# Patient Record
Sex: Female | Born: 1965 | Race: White | Hispanic: No | Marital: Single | Smoking: Light tobacco smoker
Health system: Southern US, Community
[De-identification: ages and names within clinical notes are randomized; demographics above are authoritative.]

## PROBLEM LIST (undated history)

## (undated) DIAGNOSIS — I10 Essential (primary) hypertension: Secondary | ICD-10-CM

---

## 2019-12-07 ENCOUNTER — Emergency Department (HOSPITAL_BASED_OUTPATIENT_CLINIC_OR_DEPARTMENT_OTHER)
Admission: EM | Admit: 2019-12-07 | Discharge: 2019-12-07 | Discharge status: Against Medical Advice | Payer: Managed Care, Other (non HMO) | Attending: Emergency Medicine | Admitting: Emergency Medicine

## 2019-12-07 ENCOUNTER — Emergency Department (HOSPITAL_BASED_OUTPATIENT_CLINIC_OR_DEPARTMENT_OTHER): Payer: Managed Care, Other (non HMO)

## 2019-12-07 ENCOUNTER — Encounter (HOSPITAL_BASED_OUTPATIENT_CLINIC_OR_DEPARTMENT_OTHER): Payer: Self-pay | Admitting: *Deleted

## 2019-12-07 ENCOUNTER — Other Ambulatory Visit: Payer: Self-pay

## 2019-12-07 DIAGNOSIS — F17210 Nicotine dependence, cigarettes, uncomplicated: Secondary | ICD-10-CM | POA: Insufficient documentation

## 2019-12-07 DIAGNOSIS — R1011 Right upper quadrant pain: Secondary | ICD-10-CM | POA: Diagnosis not present

## 2019-12-07 DIAGNOSIS — Z5329 Procedure and treatment not carried out because of patient's decision for other reasons: Secondary | ICD-10-CM | POA: Insufficient documentation

## 2019-12-07 DIAGNOSIS — Z79899 Other long term (current) drug therapy: Secondary | ICD-10-CM | POA: Insufficient documentation

## 2019-12-07 DIAGNOSIS — K7200 Acute and subacute hepatic failure without coma: Secondary | ICD-10-CM | POA: Diagnosis not present

## 2019-12-07 DIAGNOSIS — I10 Essential (primary) hypertension: Secondary | ICD-10-CM | POA: Insufficient documentation

## 2019-12-07 DIAGNOSIS — R7989 Other specified abnormal findings of blood chemistry: Secondary | ICD-10-CM

## 2019-12-07 DIAGNOSIS — R109 Unspecified abdominal pain: Secondary | ICD-10-CM | POA: Diagnosis present

## 2019-12-07 HISTORY — DX: Essential (primary) hypertension: I10

## 2019-12-07 LAB — COMPREHENSIVE METABOLIC PANEL
ALT: 1853 U/L — ABNORMAL HIGH (ref 0–44)
AST: 3039 U/L — ABNORMAL HIGH (ref 15–41)
Albumin: 3.5 g/dL (ref 3.5–5.0)
Alkaline Phosphatase: 160 U/L — ABNORMAL HIGH (ref 38–126)
Anion gap: 10 (ref 5–15)
BUN: 16 mg/dL (ref 6–20)
CO2: 28 mmol/L (ref 22–32)
Calcium: 8.3 mg/dL — ABNORMAL LOW (ref 8.9–10.3)
Chloride: 97 mmol/L — ABNORMAL LOW (ref 98–111)
Creatinine, Ser: 0.59 mg/dL (ref 0.44–1.00)
GFR calc Af Amer: >60 mL/min (ref >60)
GFR calc non Af Amer: >60 mL/min (ref >60)
Glucose, Bld: 103 mg/dL — ABNORMAL HIGH (ref 70–99)
Potassium: 4 mmol/L (ref 3.5–5.1)
Sodium: 135 mmol/L (ref 135–145)
Total Bilirubin: 2.3 mg/dL — ABNORMAL HIGH (ref 0.3–1.2)
Total Protein: 5.9 g/dL — ABNORMAL LOW (ref 6.5–8.1)

## 2019-12-07 LAB — CBC WITH DIFFERENTIAL/PLATELET
Abs Immature Granulocytes: 0.02 10*3/uL (ref 0.00–0.07)
Basophils Absolute: 0 10*3/uL (ref 0.0–0.1)
Basophils Relative: 1 %
Eosinophils Absolute: 0 10*3/uL (ref 0.0–0.5)
Eosinophils Relative: 1 %
HCT: 44.5 % (ref 36.0–46.0)
Hemoglobin: 14.9 g/dL (ref 12.0–15.0)
Immature Granulocytes: 0 %
Lymphocytes Relative: 25 %
Lymphs Abs: 1.5 10*3/uL (ref 0.7–4.0)
MCH: 35.5 pg — ABNORMAL HIGH (ref 26.0–34.0)
MCHC: 33.5 g/dL (ref 30.0–36.0)
MCV: 106 fL — ABNORMAL HIGH (ref 80.0–100.0)
Monocytes Absolute: 0.2 10*3/uL (ref 0.1–1.0)
Monocytes Relative: 3 %
Neutro Abs: 4.4 10*3/uL (ref 1.7–7.7)
Neutrophils Relative %: 70 %
Platelets: 179 10*3/uL (ref 150–400)
RBC: 4.2 MIL/uL (ref 3.87–5.11)
RDW: 12.9 % (ref 11.5–15.5)
WBC: 6.1 10*3/uL (ref 4.0–10.5)
nRBC: 0 % (ref 0.0–0.2)

## 2019-12-07 LAB — ACETAMINOPHEN LEVEL: Acetaminophen (Tylenol), Serum: 11 ug/mL (ref 10–30)

## 2019-12-07 LAB — URINALYSIS, MICROSCOPIC (REFLEX): WBC, UA: NONE SEEN WBC/hpf (ref 0–5)

## 2019-12-07 LAB — URINALYSIS, ROUTINE W REFLEX MICROSCOPIC
Glucose, UA: NEGATIVE mg/dL
Hgb urine dipstick: NEGATIVE
Ketones, ur: 15 mg/dL — AB
Leukocytes,Ua: NEGATIVE
Nitrite: NEGATIVE
Protein, ur: 30 mg/dL — AB
Specific Gravity, Urine: >1.03 — ABNORMAL HIGH (ref 1.005–1.030)
pH: 5.5 (ref 5.0–8.0)

## 2019-12-07 LAB — ETHANOL: Alcohol, Ethyl (B): <10 mg/dL (ref <10)

## 2019-12-07 LAB — PROTIME-INR
INR: 1.4 — ABNORMAL HIGH (ref 0.8–1.2)
Prothrombin Time: 16.9 seconds — ABNORMAL HIGH (ref 11.4–15.2)

## 2019-12-07 LAB — LIPASE, BLOOD: Lipase: 59 U/L — ABNORMAL HIGH (ref 11–51)

## 2019-12-07 MED ORDER — PANTOPRAZOLE SODIUM 40 MG IV SOLR
40.0000 mg | Freq: Once | INTRAVENOUS | Status: AC
  Administered 2019-12-07: 19:00:00 40 mg via INTRAVENOUS
  Filled 2019-12-07: qty 40

## 2019-12-07 MED ORDER — SODIUM CHLORIDE 0.9 % IV SOLN: Freq: Once | INTRAVENOUS | Status: AC

## 2019-12-07 MED ORDER — IOHEXOL 300 MG/ML  SOLN
100.0000 mL | Freq: Once | INTRAMUSCULAR | Status: AC
  Administered 2019-12-07: 20:00:00 100 mL via INTRAVENOUS

## 2019-12-07 NOTE — ED Triage Notes (Addendum)
Was sent from an urgent care w c/o upper abd pain and blood in stool bright red  X 4 weeks   w vomited x 1 and some diarrhea Has colonoscopy scheduled for march 1

## 2019-12-07 NOTE — ED Notes (Signed)
Attempted IV right AC, unable to obtain. Unable to see any access anywhere else, having another RN look for IV.

## 2019-12-07 NOTE — ED Notes (Signed)
Contacted Dr. Dorna Leitz @ [phone] for consultation

## 2019-12-07 NOTE — ED Provider Notes (Signed)
[facility] EMERGENCY DEPARTMENT Provider Note   CSN: [id number] Arrival date & time: 12/07/19  1804     History Chief Complaint  Patient presents with   Abdominal Pain    Teresa Winters is a 54 y.o. female.  HPI Patient sent from urgent care for further evaluation.  Patient has had increasing episodes of epigastric pain.  She reports it comes on sporadically but has been increasingly frequent for 2 days.  She did vomit today.  She denies any blood in the vomit.  She is scheduled to get an upper and lower endoscopy on March 1 due to history of red rectal bleeding.  It has been happening intermittently for 4 weeks.  Patient has not had any fever.  She has tried taking Pepto-Bismol without significant relief.  Patient reports that she does drink about a bottle of wine per night.  She also takes between 4 and 6 Tylenol daily on a pretty regular basis.  Patient does not have any history of liver failure.    Past Medical History:  Diagnosis Date   Hypertension     There are no problems to display for this patient.   History reviewed. No pertinent surgical history.   OB History   No obstetric history on file.     History reviewed. No pertinent family history.  Social History   Tobacco Use   Smoking status: Light Tobacco Smoker   Smokeless tobacco: Never Used  Substance Use Topics   Alcohol use: Yes    Alcohol/week: 14.0 standard drinks    Types: 14 Glasses of wine per week   Drug use: Not Currently    Home Medications Prior to Admission medications   Medication Sig Start Date End Date Taking? Authorizing Provider  budesonide-formoterol (SYMBICORT) 160-4.5 MCG/ACT inhaler INHALE 1-2 PUFFS TWICE DAILY. RINSE MOUTH AFTER USE. 10/21/19  Yes [provider]  levonorgestrel (MIRENA) 20 MCG/24HR IUD by Intrauterine route. 05/09/17  Yes [provider]  amLODipine (NORVASC) 2.5 MG tablet Take 2.5 mg by mouth daily. 10/19/19   [provider]  atorvastatin (LIPITOR) 10 MG tablet Take 10 mg by mouth daily. 10/21/19   [provider]  FLUoxetine (PROZAC) 40 MG capsule Take 80 mg by mouth daily. 10/06/19   [provider]  metoprolol succinate (TOPROL-XL) 25 MG 24 hr tablet Take 25 mg by mouth daily. 10/06/19   [provider]  montelukast (SINGULAIR) 10 MG tablet Take 10 mg by mouth daily. 11/30/19   [provider]  omeprazole (PRILOSEC) 20 MG capsule Take 20 mg by mouth daily. 08/31/19   [provider]  ondansetron (ZOFRAN-ODT) 4 MG disintegrating tablet Take 4 mg by mouth every 8 (eight) hours as needed. 10/15/19   [provider]  sucralfate (CARAFATE) 1 g tablet SMARTSIG:1 Tablet(s) By Mouth Every 12 Hours 10/20/19   [provider]  traZODone (DESYREL) 50 MG tablet TAKE 1 2 TABLETS BY MOUTH EVERY DAY AT NIGHT FOR SLEEP 06/29/19   [provider]    Allergies    Patient has no known allergies.  Review of Systems   Review of Systems 10 Systems reviewed and are negative for acute change except as noted in the HPI. Physical Exam Updated Vital Signs BP (!) 151/93 (BP Location: Right Arm)    Pulse 78    Temp 98.4 F (36.9 C) (Oral)    Resp 20    Ht 5\' 3"  (1.6 m)    Wt 93.9 kg  SpO2 96%    BMI 36.67 kg/m   Physical Exam Constitutional:      Comments: Patient is alert nontoxic.  She is clinically well in appearance.  No acute distress.  HENT:     Head: Normocephalic and atraumatic.  Eyes:     Extraocular Movements: Extraocular movements intact.     Conjunctiva/sclera: Conjunctivae normal.  Cardiovascular:     Rate and Rhythm: Normal rate and regular rhythm.     Pulses: Normal pulses.     Heart sounds: Normal heart sounds.  Pulmonary:     Effort: Pulmonary effort is normal.     Breath sounds: Normal breath sounds.  Abdominal:     General: There is no distension.     Palpations: Abdomen is soft.     Tenderness: There is no abdominal tenderness.  There is no guarding.     Comments: Patient does not have significant reproducible discomfort.  There is some mild epigastric discomfort without guarding.  No significant right upper quadrant tenderness.  Musculoskeletal:        General: No swelling or tenderness. Normal range of motion.     Right lower leg: No edema.     Left lower leg: No edema.  Skin:    General: Skin is warm and dry.  Neurological:     General: No focal deficit present.     Mental Status: She is oriented to person, place, and time.     Coordination: Coordination normal.     Comments: Patient is up and amatory without difficulty.  All movements are coordinated purposeful symmetric.  Mental status is clear.  Psychiatric:        Mood and Affect: Mood normal.     ED Results / Procedures / Treatments   Labs (all labs ordered are listed, but only abnormal results are displayed) Labs Reviewed  COMPREHENSIVE METABOLIC PANEL - Abnormal; Notable for the following components:      Result Value   Chloride 97 (*)    Glucose, Bld 103 (*)    Calcium 8.3 (*)    Total Protein 5.9 (*)    AST 3,039 (*)    ALT 1,853 (*)    Alkaline Phosphatase 160 (*)    Total Bilirubin 2.3 (*)    All other components within normal limits  LIPASE, BLOOD - Abnormal; Notable for the following components:   Lipase 59 (*)    All other components within normal limits  CBC WITH DIFFERENTIAL/PLATELET - Abnormal; Notable for the following components:   MCV 106.0 (*)    MCH 35.5 (*)    All other components within normal limits  URINALYSIS, ROUTINE W REFLEX MICROSCOPIC - Abnormal; Notable for the following components:   Specific Gravity, Urine >1.030 (*)    Bilirubin Urine MODERATE (*)    Ketones, ur 15 (*)    Protein, ur 30 (*)    All other components within normal limits  URINALYSIS, MICROSCOPIC (REFLEX) - Abnormal; Notable for the following components:   Bacteria, UA RARE (*)    All other components within normal limits  PROTIME-INR -  Abnormal; Notable for the following components:   Prothrombin Time 16.9 (*)    INR 1.4 (*)    All other components within normal limits  URINE CULTURE  ETHANOL  ACETAMINOPHEN LEVEL  HEPATITIS PANEL, ACUTE    EKG None  Radiology CT Abdomen Pelvis W Contrast  Result Date: 12/07/2019 CLINICAL DATA:  Epigastric pain, blood in stool EXAM: CT ABDOMEN AND PELVIS WITH CONTRAST  TECHNIQUE: Multidetector CT imaging of the abdomen and pelvis was performed using the standard protocol following bolus administration of intravenous contrast. CONTRAST:  OMNIPAQUE IOHEXOL 300 MG/ML  SOLN COMPARISON:  02/24/2011 FINDINGS: Lower chest: Lung bases are clear. Hepatobiliary: Mild diffuse fatty infiltration of the liver. No focal parenchymal abnormalities. The gallbladder is unremarkable. Pancreas: Unremarkable. No pancreatic ductal dilatation or surrounding inflammatory changes. Spleen: Normal in size without focal abnormality. Adrenals/Urinary Tract: Adrenal glands are unremarkable. Kidneys are normal, without renal calculi, focal lesion, or hydronephrosis. Bladder is unremarkable. Stomach/Bowel: No bowel obstruction or ileus. Normal appendix right lower quadrant. No inflammatory changes. Minimal sigmoid diverticulosis without diverticulitis. Vascular/Lymphatic: Aortic atherosclerosis. No enlarged abdominal or pelvic lymph nodes. Reproductive: Uterus is atrophic with IUD in place. No adnexal masses. Other: Stable fat containing midline ventral hernia. No bowel herniation. No free fluid or free gas. Musculoskeletal: No acute or destructive bony lesions. Reconstructed images demonstrate no additional findings. IMPRESSION: 1. No acute findings to explain the patient's epigastric pain. 2. Mild fatty infiltration of the liver. 3. Minimal sigmoid diverticulosis without diverticulitis. 4. Stable fat containing midline ventral hernia. 5. Aortic Atherosclerosis (ICD10-I70.0). Electronically Signed   By: Sharlet Salina M.D.    On: 12/07/2019 20:20   US Abdomen Limited RUQ  Result Date: 12/07/2019 CLINICAL DATA:  Elevated liver function tests, epigastric pain EXAM: ULTRASOUND ABDOMEN LIMITED RIGHT UPPER QUADRANT COMPARISON:  12/07/2019 FINDINGS: Gallbladder: No gallstones or wall thickening visualized. No sonographic Murphy sign noted by sonographer. Common bile duct: Diameter: 2 mm Liver: Mild diffuse increased liver echotexture consistent with fatty infiltration. Minimally complex right lobe liver cyst measuring 1.2 cm in maximal dimension with a single thin septation. No other focal abnormalities. Portal vein is patent on color Doppler imaging with normal direction of blood flow towards the liver. Other: None. IMPRESSION: 1. Small minimally complex cyst within the right lobe liver, of doubtful clinical significance. 2. Mild fatty infiltration of the liver. 3. Otherwise unremarkable exam. Electronically Signed   By: Sharlet Salina M.D.   On: 12/07/2019 21:10    Procedures Procedures (including critical care time)  Medications Ordered in ED Medications  0.9 %  sodium chloride infusion ( Intravenous New Bag/Given 12/07/19 1907)  pantoprazole (PROTONIX) injection 40 mg (40 mg Intravenous Given 12/07/19 1908)  iohexol (OMNIPAQUE) 300 MG/ML solution 100 mL (100 mLs Intravenous Contrast Given 12/07/19 2006)    ED Course  I have reviewed the triage vital signs and the nursing notes.  Pertinent labs & imaging results that were available during my care of the patient were reviewed by me and considered in my medical decision making (see chart for details).  Clinical Course as of Dec 06 2246  Mon Dec 07, 2019  2152 Consult: Reviewed with Aline August   [MP]    Clinical Course User Index [MP] Arby Barrette, MD   MDM Rules/Calculators/A&P                      Consult: I reviewed this case with the covering gastroenterologist for the patient's group, Dr. Karena Addison.  Her recommendation was for admission to the hospital  for further evaluation and management.  She suggested the patient might need transport to [facility] for specialty consult with hepatic specialist.  The patient was extremely reluctant to be admitted to the hospital for further evaluation.  We initially discussed admission to [facility] which is a facility that her physician work from.  I then offered to consult the  GI physicians at [facility] to determine if there was any reasonable possibility of her getting expeditious follow-up with hepatic specialist there.  Consultation with Dr. Chilton Si of gastroenterology at [facility] confirmed a concern for acute hepatic failure that needed expeditious evaluation and also suggested admission to [facility].  I reinforced to the patient that if she should go on to develop fulminant hepatic failure, she might die or have severe disability and require liver transplant.  Initially she agreed that due to the serious concerns that she would go to [facility] but shortly thereafter told me she changed her mind.  She reports that she is going to call her gastroenterologist first thing in the morning but will not be admitted to the hospital tonight.  Patient is alert and nontoxic.  She shows no signs of mental confusion.  Her gait is steady as she goes back and forth to the bathroom.  Speech is clear comprehension is good.  She has good eye contact and able to reiterate back the concerns.  Patient is signed out AGAINST MEDICAL ADVICE with known risk of progressive liver failure with possibility of death or severe disability. Final Clinical Impression(s) / ED Diagnoses Final diagnoses:  Acute liver failure without hepatic coma  Left against medical advice    Rx / DC Orders ED Discharge Orders    None       Arby Barrette, MD 12/08/19 0020

## 2019-12-07 NOTE — Discharge Instructions (Addendum)
1.  You are leaving AGAINST MEDICAL ADVICE.  You risk having liver fail and potentially need to get a transplant.  You risk severe chronic medical illness and possible death. 2.  Call your gastroenterologist office first thing tomorrow morning to schedule your recheck and your referral to a hepatic specialist at [facility] 3.  Return to the emergency department immediately if you wish to continue your care.  You are recommended to go to [facility] emergency department because there are specialists at [facility] hospital to address your needs.

## 2019-12-08 LAB — HEPATITIS PANEL, ACUTE
HCV Ab: NONREACTIVE
Hep A IgM: NONREACTIVE
Hep B C IgM: NONREACTIVE
Hepatitis B Surface Ag: NONREACTIVE

## 2019-12-09 LAB — URINE CULTURE

## 2020-03-05 IMAGING — CT CT ABD-PELV W/ CM
2 of 5 series · 16 of 46 positions shown, 18 images · IV contrast (omnipaque)
Comparison: 02/24/2011

CLINICAL DATA: Epigastric pain, blood in stool

EXAM:
CT ABDOMEN AND PELVIS WITH CONTRAST
TECHNIQUE: Multidetector CT imaging of the abdomen and pelvis was performed
using the standard protocol following bolus administration of
intravenous contrast.
CONTRAST:  100mL OMNIPAQUE IOHEXOL 300 MG/ML  SOLN

[Series 2: axial st · axial · 0.77mm/px · z∈[+510,+855]mm · 13 of 79 slices shown, 15 images]
[im 5/79  soft-tissue]
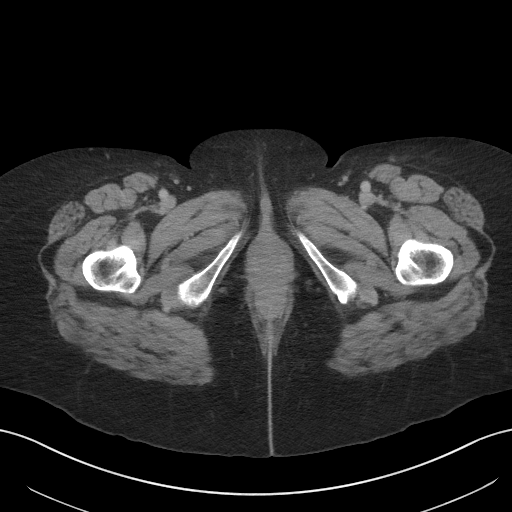
[im 5/79  bone]
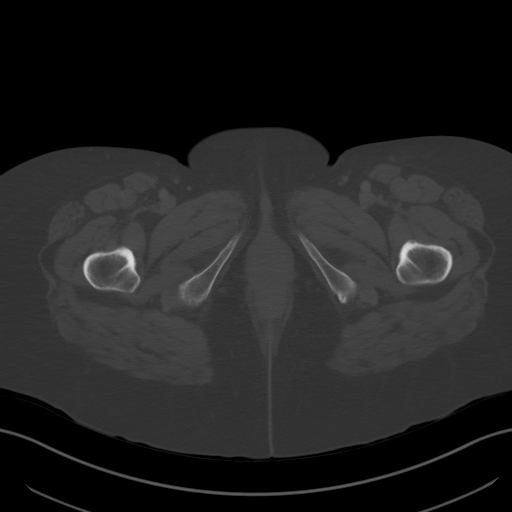
[im 13/79  soft-tissue]
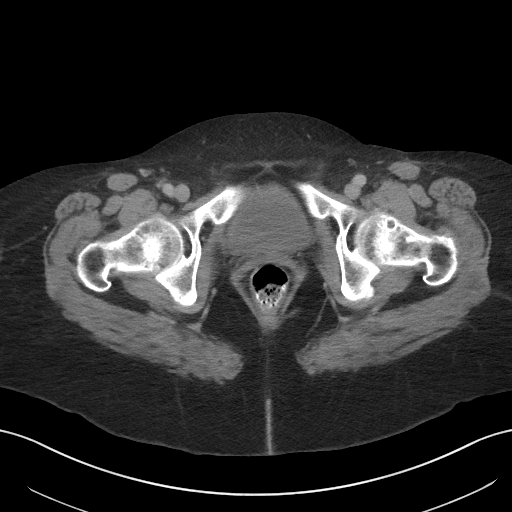
[im 17/79  soft-tissue]
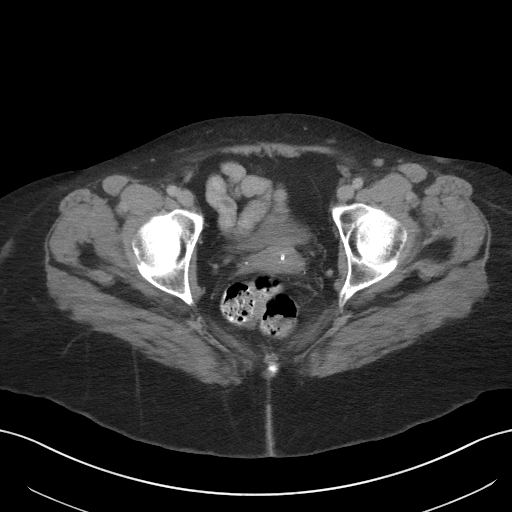
[im 21/79  soft-tissue]
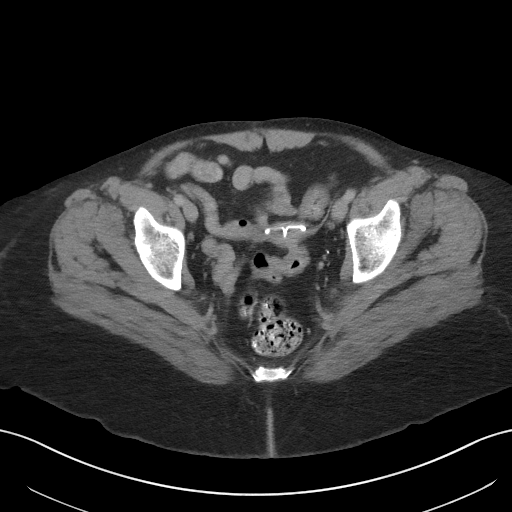
[im 29/79  soft-tissue]
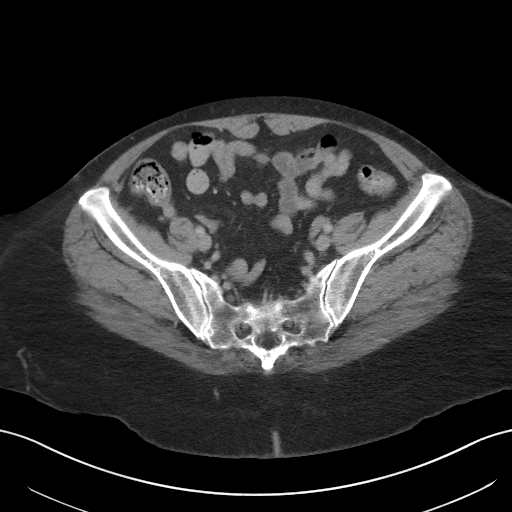
[im 33/79  soft-tissue]
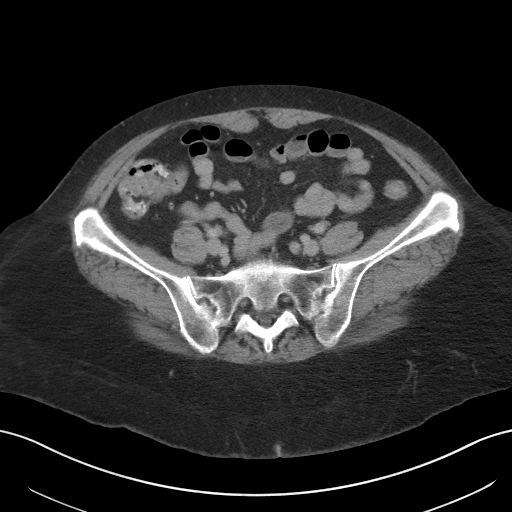
[im 42/79  soft-tissue]
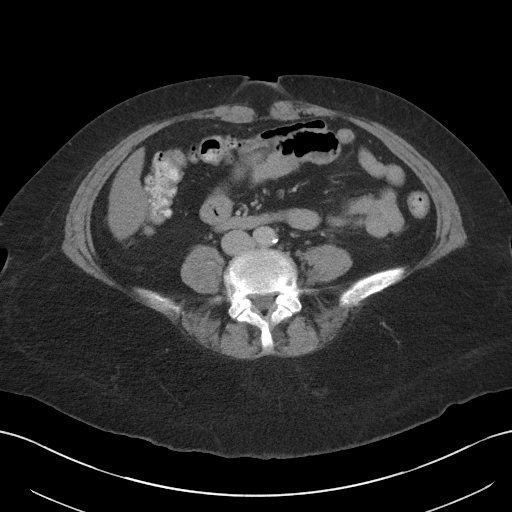
[im 46/79  soft-tissue]
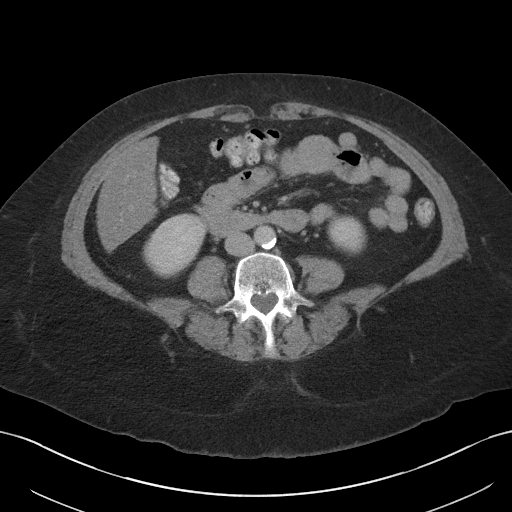
[im 50/79  soft-tissue]
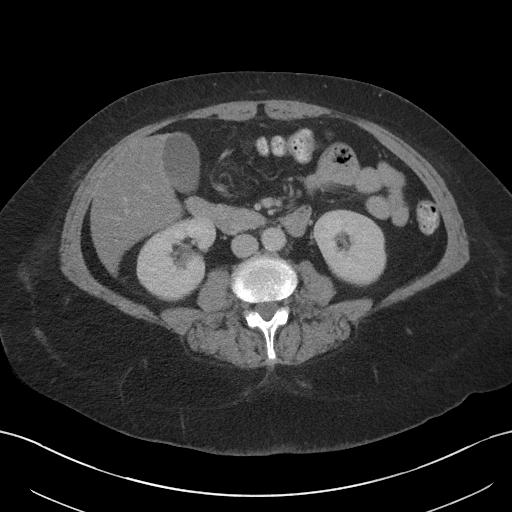
[im 50/79  bone]
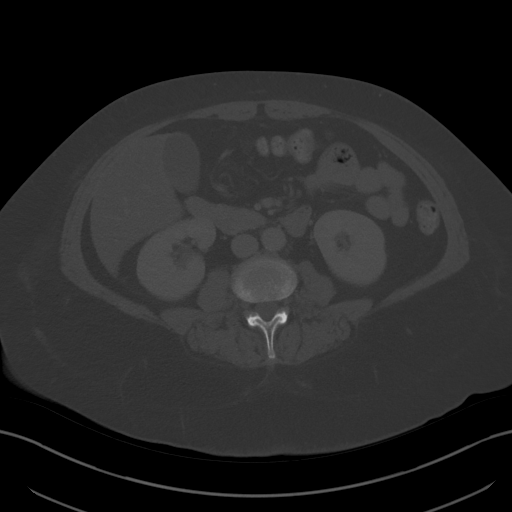
[im 58/79  soft-tissue]
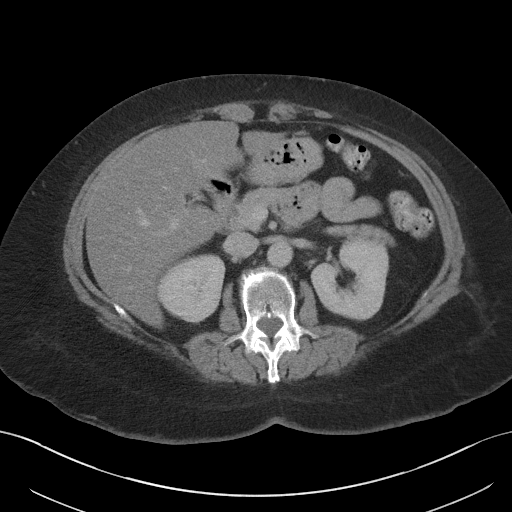
[im 62/79  soft-tissue]
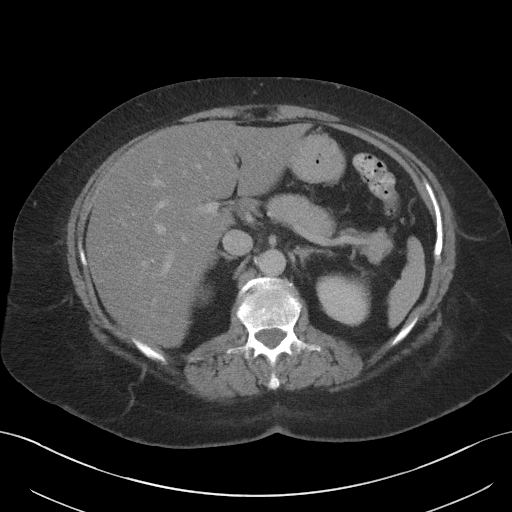
[im 66/79  soft-tissue]
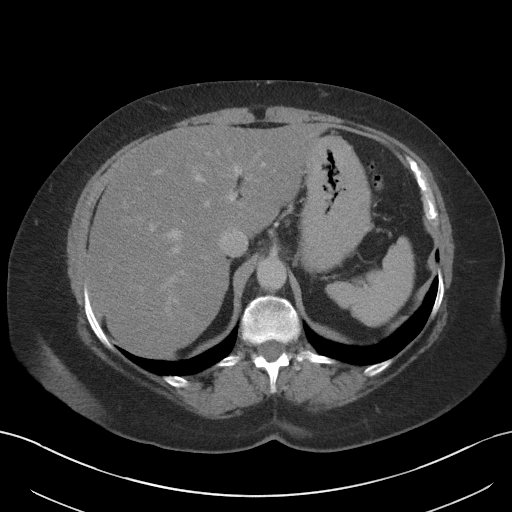
[im 74/79  soft-tissue]
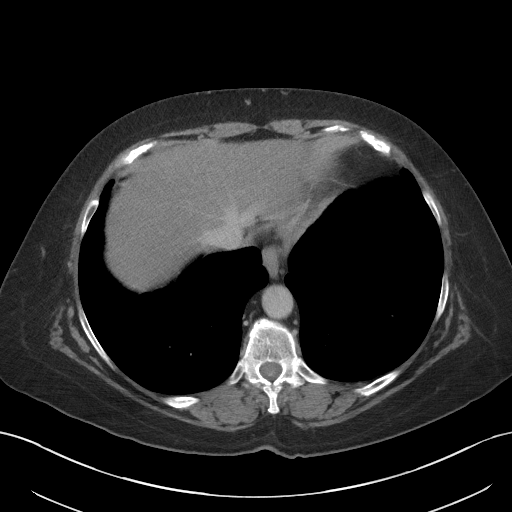

[Series 5: coronal st · coronal · 0.70mm/px · 3 of 101 slices shown]
[im 34/101  soft-tissue]
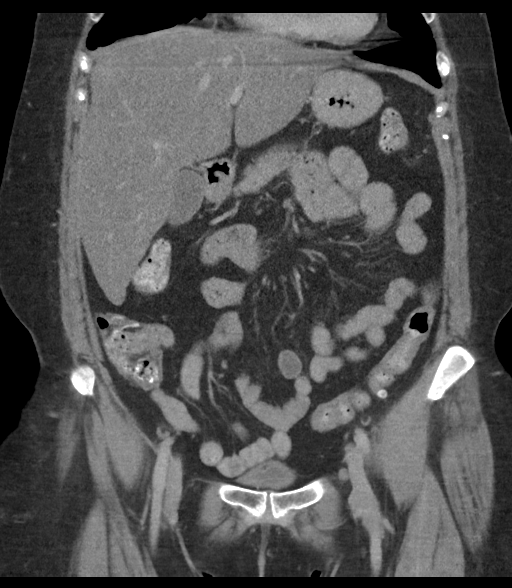
[im 45/101  soft-tissue]
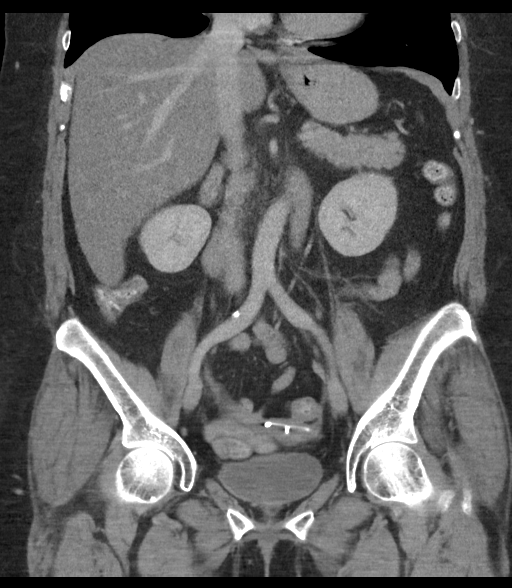
[im 56/101  soft-tissue]
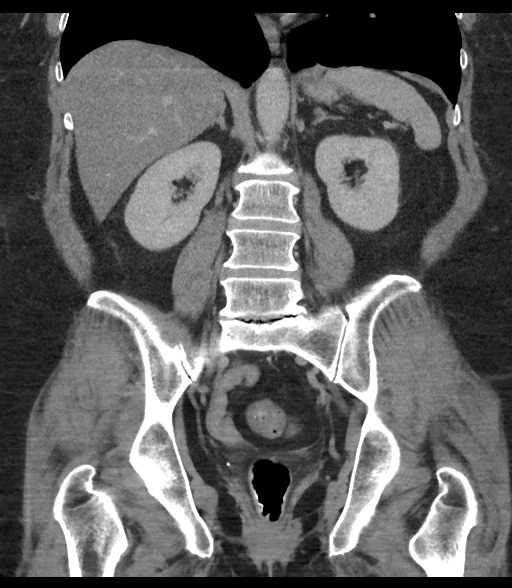

[16 of 46 positions shown; findings below may reference images not displayed]

FINDINGS: Lower chest: Lung bases are clear.

Hepatobiliary: Mild diffuse fatty infiltration of the liver. No
focal parenchymal abnormalities. The gallbladder is unremarkable.

Pancreas: Unremarkable. No pancreatic ductal dilatation or
surrounding inflammatory changes.

Spleen: Normal in size without focal abnormality.

Adrenals/Urinary Tract: Adrenal glands are unremarkable. Kidneys are
normal, without renal calculi, focal lesion, or hydronephrosis.
Bladder is unremarkable.

Stomach/Bowel: No bowel obstruction or ileus. Normal appendix right
lower quadrant. No inflammatory changes. Minimal sigmoid
diverticulosis without diverticulitis.

Vascular/Lymphatic: Aortic atherosclerosis. No enlarged abdominal or
pelvic lymph nodes.

Reproductive: Uterus is atrophic with IUD in place. No adnexal
masses.

Other: Stable fat containing midline ventral hernia. No bowel
herniation. No free fluid or free gas.

Musculoskeletal: No acute or destructive bony lesions. Reconstructed
images demonstrate no additional findings.
IMPRESSION: 1. No acute findings to explain the patient's epigastric pain.
2. Mild fatty infiltration of the liver.
3. Minimal sigmoid diverticulosis without diverticulitis.
4. Stable fat containing midline ventral hernia.
5. Aortic Atherosclerosis ([id number]).
